# Patient Record
Sex: Female | Born: 2005 | Race: White | Hispanic: Yes | Marital: Single | Smoking: Never smoker
Health system: Southern US, Community
[De-identification: ages and names within clinical notes are randomized; demographics above are authoritative.]

## PROBLEM LIST (undated history)

## (undated) DIAGNOSIS — B2790 Infectious mononucleosis, unspecified without complication: Secondary | ICD-10-CM

## (undated) DIAGNOSIS — Z789 Other specified health status: Secondary | ICD-10-CM

## (undated) HISTORY — PX: NO PAST SURGERIES: SHX2092

## (undated) HISTORY — DX: Infectious mononucleosis, unspecified without complication: B27.90

---
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
## 2024-04-07 ENCOUNTER — Ambulatory Visit: Admitting: Dermatology
